# Patient Record
Sex: Female | Born: 1935 | Race: White | Hispanic: No
Health system: Southern US, Community
[De-identification: ages and names within clinical notes are randomized; demographics above are authoritative.]

---

## 2004-02-09 ENCOUNTER — Other Ambulatory Visit: Payer: Self-pay

## 2004-02-10 ENCOUNTER — Other Ambulatory Visit: Payer: Self-pay

## 2004-09-06 ENCOUNTER — Ambulatory Visit: Payer: Self-pay | Admitting: Internal Medicine

## 2004-12-01 ENCOUNTER — Ambulatory Visit: Payer: Self-pay | Admitting: Urology

## 2006-05-16 ENCOUNTER — Ambulatory Visit: Payer: Self-pay | Admitting: Specialist

## 2007-04-05 ENCOUNTER — Other Ambulatory Visit: Payer: Self-pay

## 2007-04-05 ENCOUNTER — Emergency Department: Payer: Self-pay | Admitting: Emergency Medicine

## 2007-04-18 ENCOUNTER — Ambulatory Visit: Payer: Self-pay | Admitting: Specialist

## 2007-05-04 ENCOUNTER — Inpatient Hospital Stay: Payer: Self-pay | Admitting: Specialist

## 2007-05-04 ENCOUNTER — Other Ambulatory Visit: Payer: Self-pay

## 2008-06-25 ENCOUNTER — Other Ambulatory Visit: Payer: Self-pay

## 2008-06-26 ENCOUNTER — Inpatient Hospital Stay: Payer: Self-pay | Admitting: *Deleted

## 2008-06-26 ENCOUNTER — Other Ambulatory Visit: Payer: Self-pay

## 2009-07-06 ENCOUNTER — Emergency Department: Payer: Self-pay | Admitting: Emergency Medicine

## 2009-07-06 ENCOUNTER — Inpatient Hospital Stay: Payer: Self-pay | Admitting: *Deleted

## 2009-07-10 ENCOUNTER — Emergency Department: Payer: Self-pay | Admitting: Unknown Physician Specialty

## 2009-12-06 ENCOUNTER — Ambulatory Visit: Payer: Self-pay | Admitting: Urology

## 2010-03-18 ENCOUNTER — Inpatient Hospital Stay: Payer: Self-pay | Admitting: Surgery

## 2010-06-24 ENCOUNTER — Emergency Department: Payer: Self-pay | Admitting: Emergency Medicine

## 2010-11-24 ENCOUNTER — Emergency Department: Payer: Self-pay | Admitting: Emergency Medicine

## 2011-07-04 ENCOUNTER — Inpatient Hospital Stay: Payer: Self-pay | Admitting: Internal Medicine

## 2012-03-20 ENCOUNTER — Emergency Department: Payer: Self-pay | Admitting: Internal Medicine

## 2012-05-02 LAB — URINALYSIS, COMPLETE
Bilirubin,UR: NEGATIVE
Glucose,UR: NEGATIVE mg/dL (ref 0–75)
Ph: 7 (ref 4.5–8.0)
RBC,UR: 7 /HPF (ref 0–5)
Specific Gravity: 1.012 (ref 1.003–1.030)
Squamous Epithelial: 1

## 2012-05-03 ENCOUNTER — Inpatient Hospital Stay: Payer: Self-pay | Admitting: Specialist

## 2012-05-03 LAB — TROPONIN I: Troponin-I: <0.02 ng/mL

## 2012-05-03 LAB — CBC
HCT: 38.6 % (ref 35.0–47.0)
MCH: 31.7 pg (ref 26.0–34.0)
MCHC: 34.3 g/dL (ref 32.0–36.0)
MCV: 93 fL (ref 80–100)
Platelet: 230 10*3/uL (ref 150–440)
RDW: 15.3 % — ABNORMAL HIGH (ref 11.5–14.5)
WBC: 8.9 10*3/uL (ref 3.6–11.0)

## 2012-05-03 LAB — COMPREHENSIVE METABOLIC PANEL
Alkaline Phosphatase: 61 U/L (ref 50–136)
BUN: 16 mg/dL (ref 7–18)
Bilirubin,Total: 0.2 mg/dL (ref 0.2–1.0)
Chloride: 102 mmol/L (ref 98–107)
Co2: 28 mmol/L (ref 21–32)
Creatinine: 0.63 mg/dL (ref 0.60–1.30)
EGFR (African American): >60
EGFR (Non-African Amer.): >60
SGOT(AST): 19 U/L (ref 15–37)
SGPT (ALT): 16 U/L
Sodium: 140 mmol/L (ref 136–145)
Total Protein: 7.6 g/dL (ref 6.4–8.2)

## 2012-05-03 LAB — CK TOTAL AND CKMB (NOT AT ARMC)
CK, Total: 38 U/L (ref 21–215)
CK-MB: <0.5 ng/mL — ABNORMAL LOW (ref 0.5–3.6)

## 2012-05-04 LAB — HEMOGLOBIN A1C: Hemoglobin A1C: 6 % (ref 4.2–6.3)

## 2012-05-05 LAB — BASIC METABOLIC PANEL
BUN: 9 mg/dL (ref 7–18)
Calcium, Total: 8.7 mg/dL (ref 8.5–10.1)
Chloride: 103 mmol/L (ref 98–107)
Creatinine: 0.39 mg/dL — ABNORMAL LOW (ref 0.60–1.30)
Osmolality: 280 (ref 275–301)
Potassium: 3.4 mmol/L — ABNORMAL LOW (ref 3.5–5.1)

## 2012-10-02 ENCOUNTER — Emergency Department: Payer: Self-pay | Admitting: Emergency Medicine

## 2012-11-07 ENCOUNTER — Emergency Department: Payer: Self-pay | Admitting: Internal Medicine

## 2012-11-07 LAB — COMPREHENSIVE METABOLIC PANEL
Albumin: 3.1 g/dL — ABNORMAL LOW (ref 3.4–5.0)
Anion Gap: 5 — ABNORMAL LOW (ref 7–16)
Bilirubin,Total: 0.3 mg/dL (ref 0.2–1.0)
Calcium, Total: 8.8 mg/dL (ref 8.5–10.1)
Co2: 28 mmol/L (ref 21–32)
EGFR (African American): >60
Osmolality: 279 (ref 275–301)
SGOT(AST): 15 U/L (ref 15–37)
SGPT (ALT): 15 U/L (ref 12–78)
Sodium: 140 mmol/L (ref 136–145)
Total Protein: 7 g/dL (ref 6.4–8.2)

## 2012-11-07 LAB — URINALYSIS, COMPLETE
Bilirubin,UR: NEGATIVE
Glucose,UR: NEGATIVE mg/dL (ref 0–75)
Nitrite: POSITIVE
Ph: 7 (ref 4.5–8.0)
RBC,UR: 6 /HPF (ref 0–5)
Specific Gravity: 1.011 (ref 1.003–1.030)

## 2012-11-07 LAB — TROPONIN I: Troponin-I: <0.02 ng/mL

## 2012-11-07 LAB — CBC
HCT: 39.2 % (ref 35.0–47.0)
HGB: 12.6 g/dL (ref 12.0–16.0)
MCH: 29.7 pg (ref 26.0–34.0)
MCV: 92 fL (ref 80–100)
Platelet: 210 10*3/uL (ref 150–440)

## 2012-11-07 LAB — PRO B NATRIURETIC PEPTIDE: B-Type Natriuretic Peptide: 1086 pg/mL — ABNORMAL HIGH (ref 0–450)

## 2012-11-09 LAB — URINE CULTURE

## 2012-11-13 LAB — CULTURE, BLOOD (SINGLE)

## 2012-12-24 ENCOUNTER — Emergency Department: Payer: Self-pay | Admitting: Emergency Medicine

## 2013-03-02 DEATH — deceased

## 2013-09-27 IMAGING — CR DG LUMBAR SPINE 2-3V
1 series · 3 of 3 positions shown · non-contrast
Comparison: none

REASON FOR EXAM: s/p fall
COMMENTS:

[Series 1: t lumbar spine ap · 0.14mm/px · 3 of 3 slices shown]
[im 1/3]
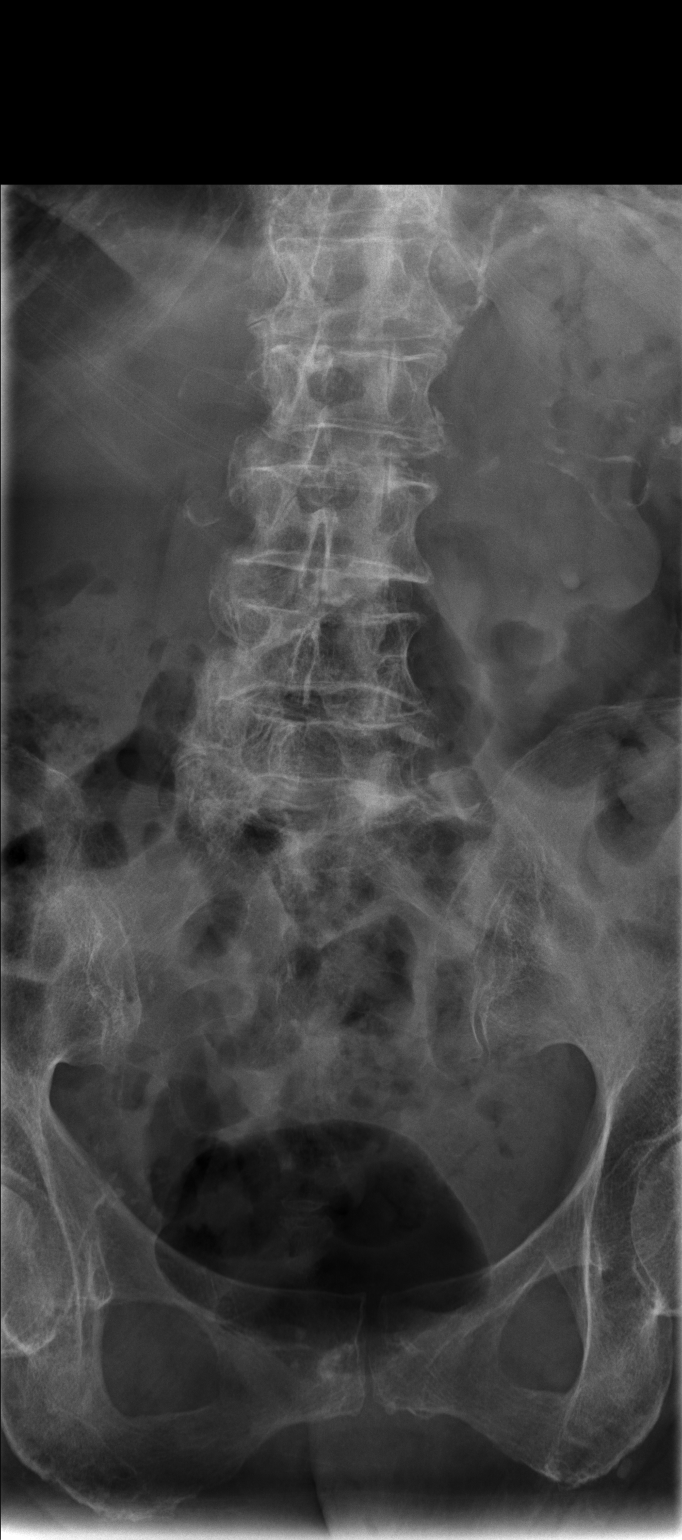
[im 2/3]
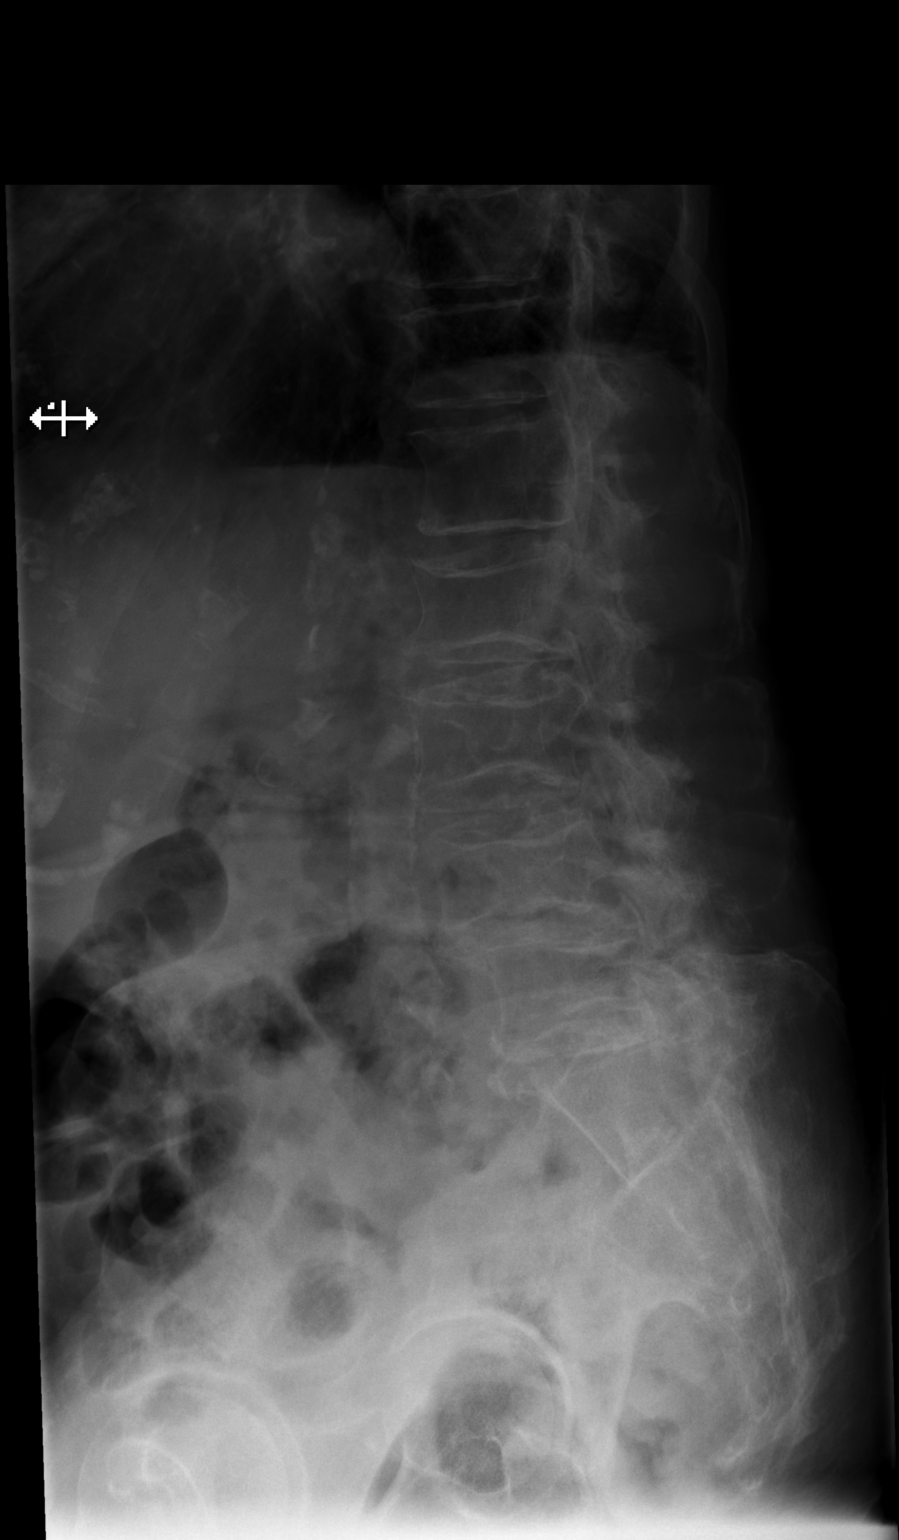
[im 3/3]
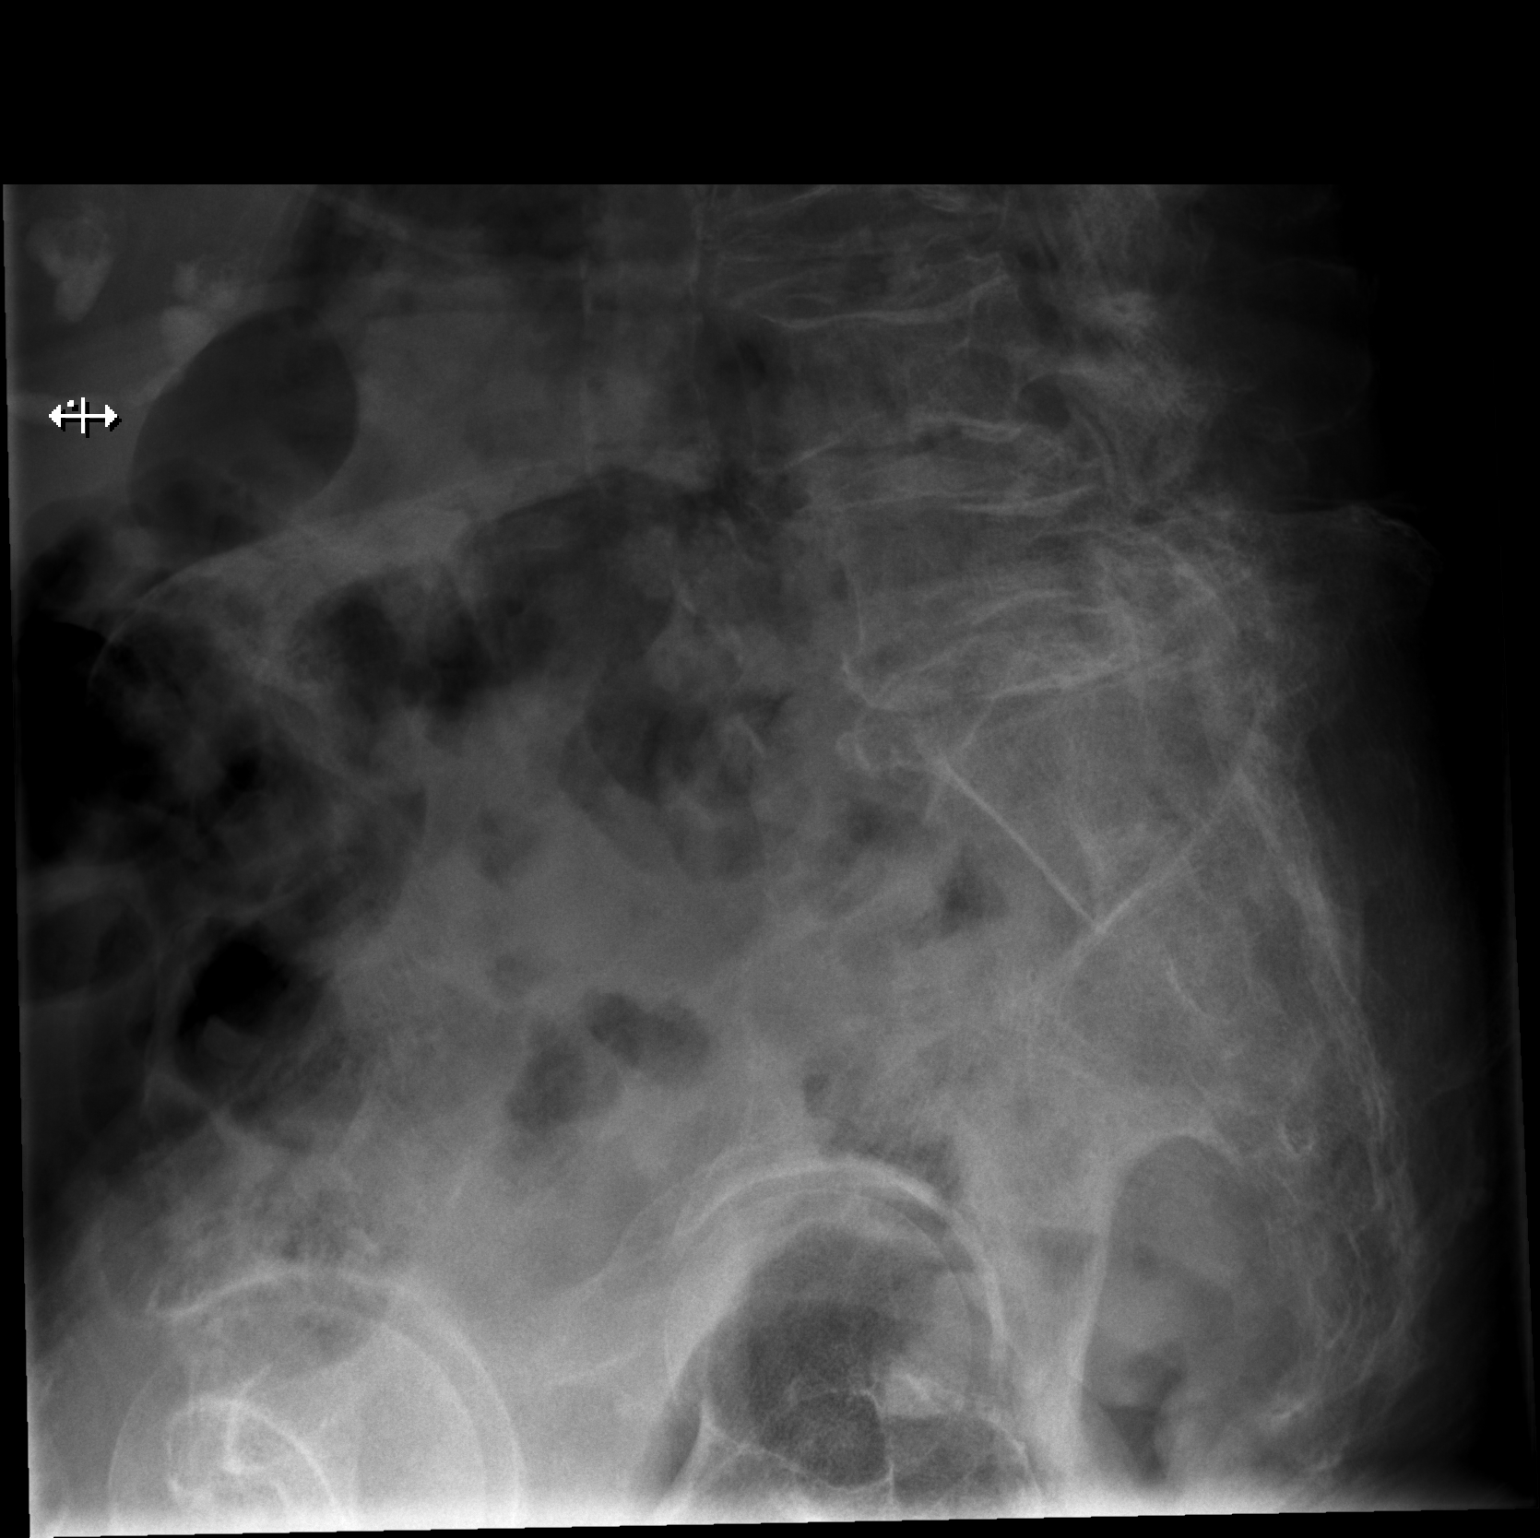

[3 of 3 positions shown; findings below may reference images not displayed]

PROCEDURE:     DXR - DXR LUMBAR SPINE AP AND LATERAL  - December 24, 2012  [DATE]

RESULT:     Comparison is made to study 02 May, 2012. There is diffuse
osteopenia. A there is slight loss of height in the L5 vertebral body with
anterolisthesis of L5 on S1 and L4 on L5 which appear to be unchanged. There
is some loss of height in the L4 vertebral body. There is slight loss of
height in the L3 vertebral body. Alignment is otherwise maintained.
Atherosclerotic disease is present.
IMPRESSION: Severe osteopenia with multilevel compression deformity of
L3-L5 which is of uncertain chronicity. MRI followup may be beneficial.
Anterolisthesis of L5 on S1 which is minimal and of L4 on L5 which is
unchanged.

[REDACTED]

## 2013-09-27 IMAGING — CT CT HEAD WITHOUT CONTRAST
2 series · 15 of 30 positions shown, 19 images · non-contrast
Comparison: none

REASON FOR EXAM: s/p fall
COMMENTS:

[Series 2: without · axial · non-contrast · 0.40mm/px · z∈[+466,+586]mm · 13 of 29 slices shown, 17 images]
[im 3/29  brain]
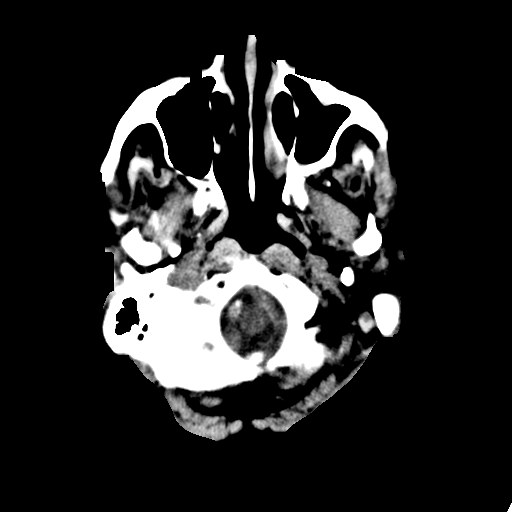
[im 3/29  bone]
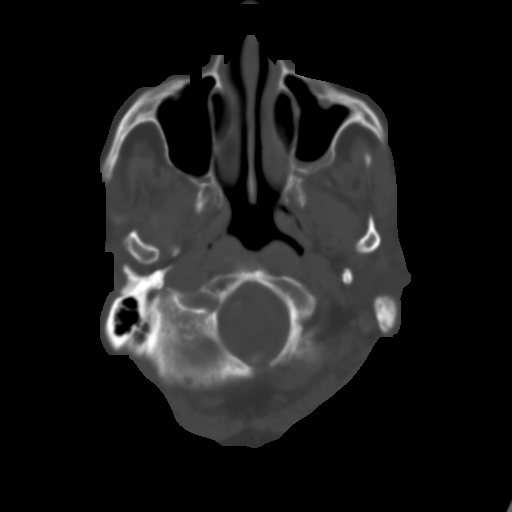
[im 5/29  brain]
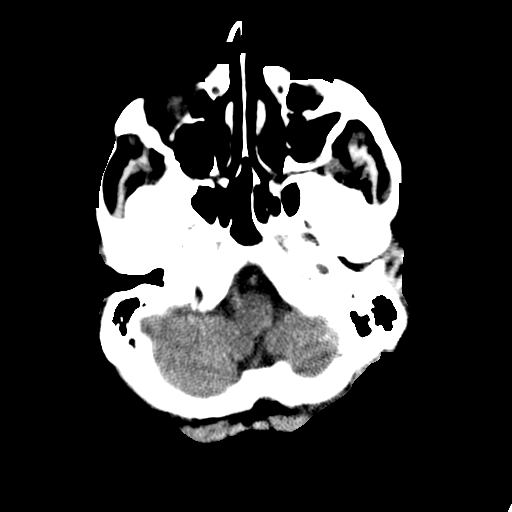
[im 7/29  brain]
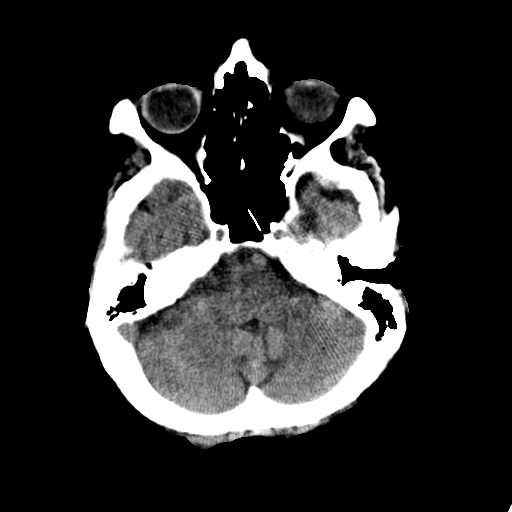
[im 9/29  brain]
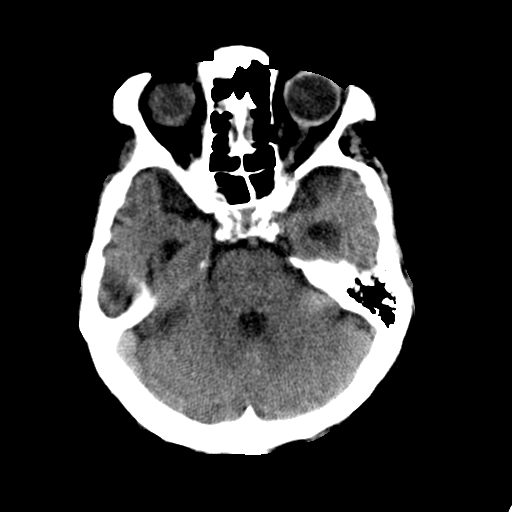
[im 11/29  brain]
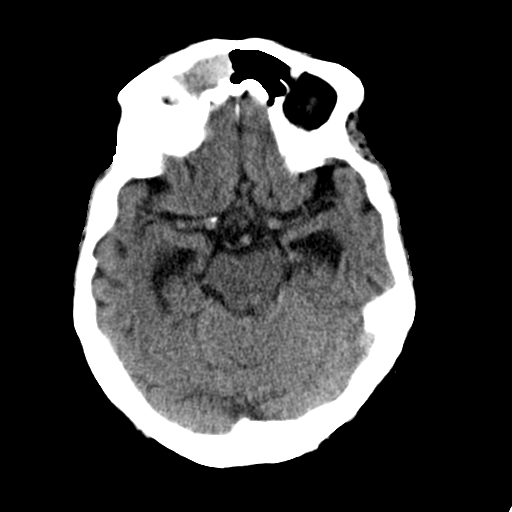
[im 11/29  bone]
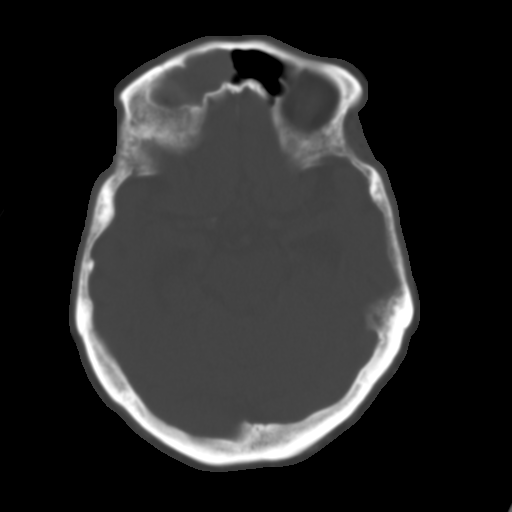
[im 13/29  brain]
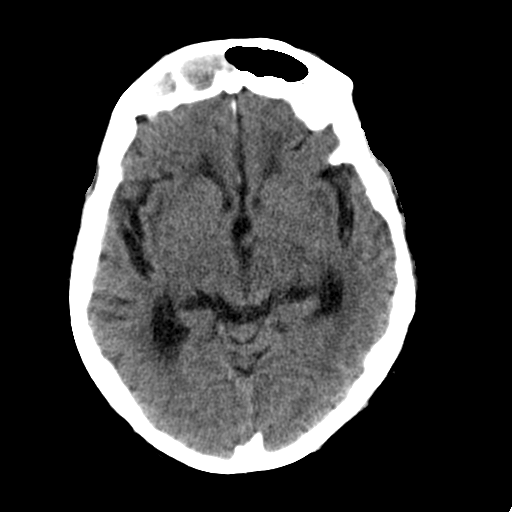
[im 15/29  brain]
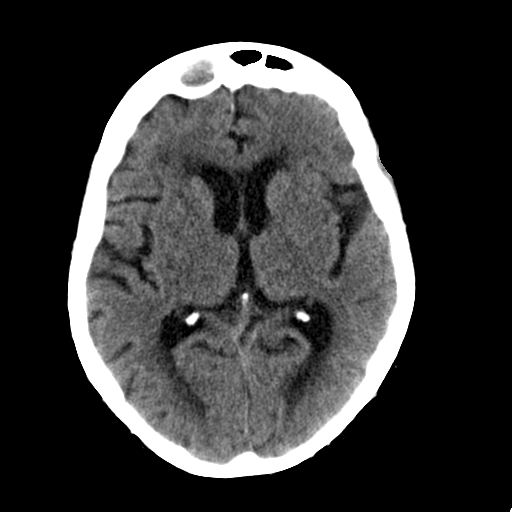
[im 17/29  brain]
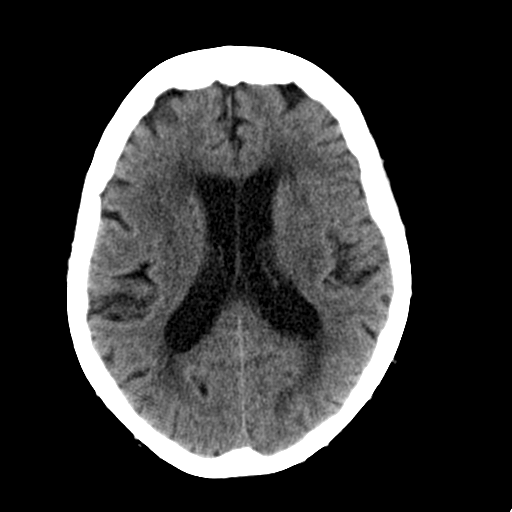
[im 19/29  brain]
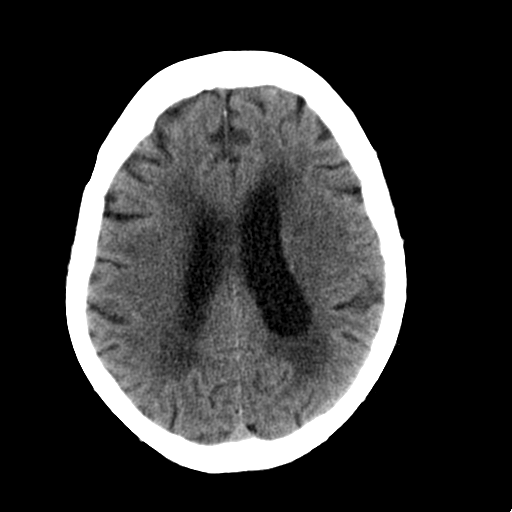
[im 19/29  bone]
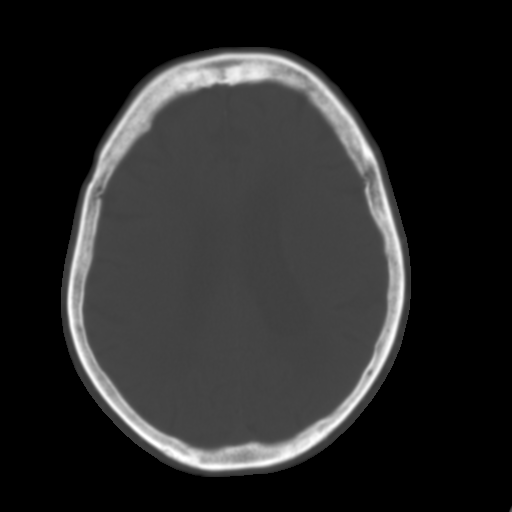
[im 21/29  brain]
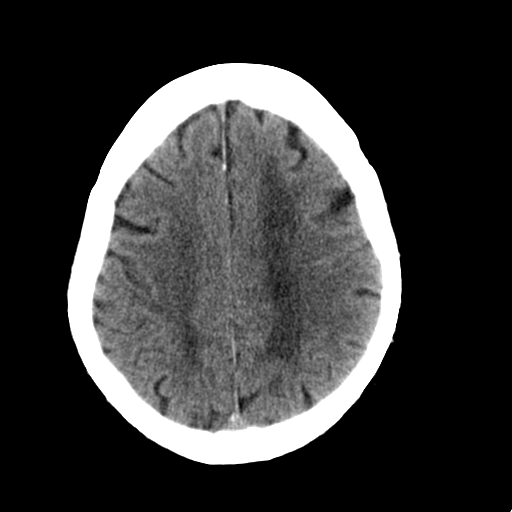
[im 23/29  brain]
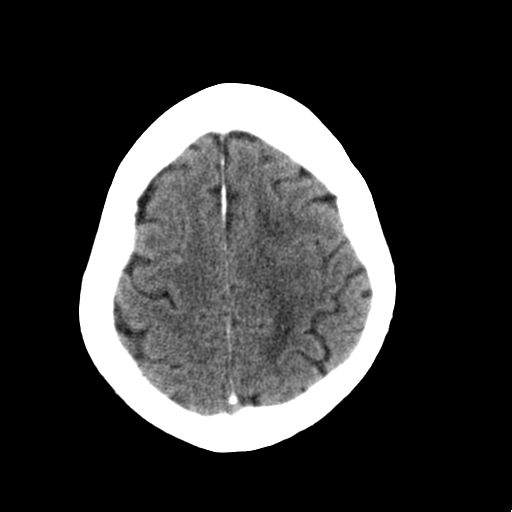
[im 25/29  brain]
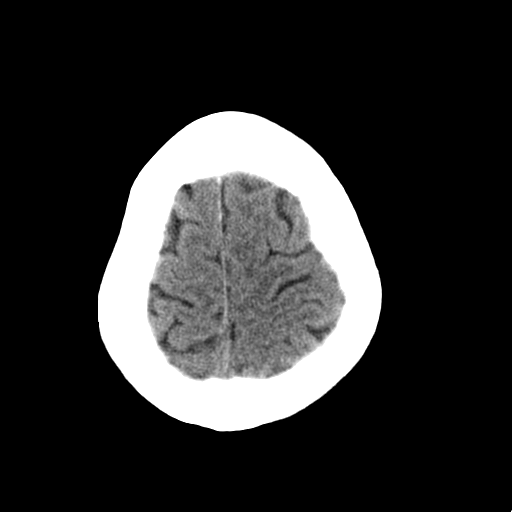
[im 27/29  brain]
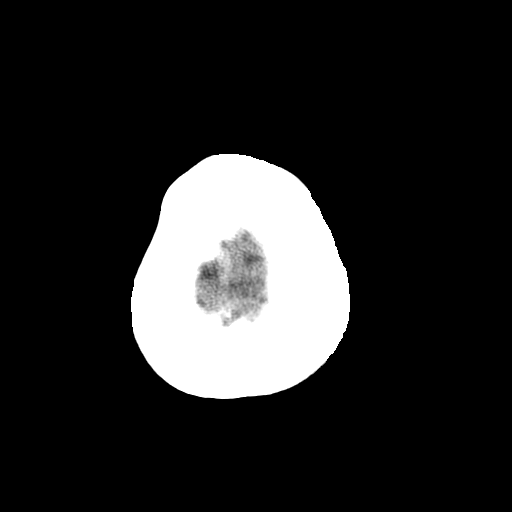
[im 27/29  bone]
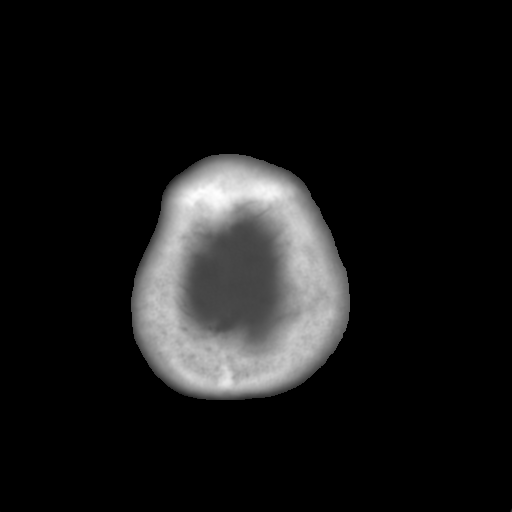

[Series 3: bone · axial · 0.40mm/px · z∈[+466,+486]mm · 2 of 29 slices shown]
[im 3/29  bone]
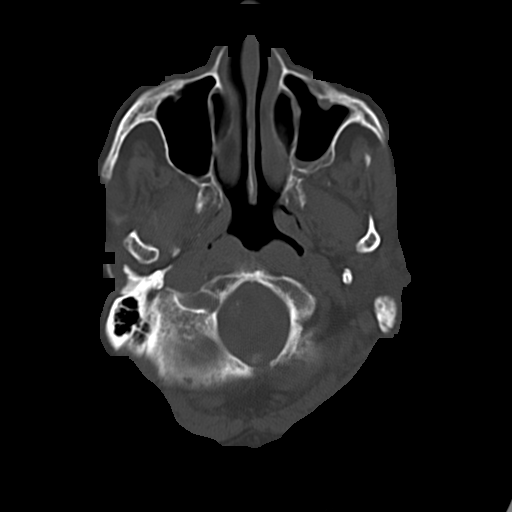
[im 7/29  bone]
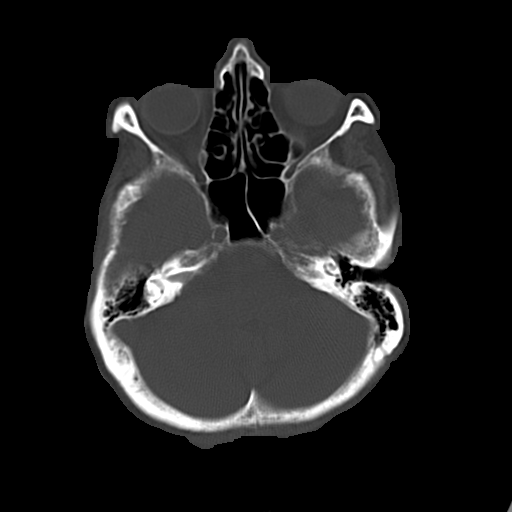

[15 of 30 positions shown; findings below may reference images not displayed]

PROCEDURE:     CT  - CT HEAD WITHOUT CONTRAST  - December 24, 2012  [DATE]

RESULT:     Noncontrast emergent CT of the brain is compared to the previous
study 02 October, 2012. There is prominence of the ventricles and sulci
consistent with diffuse atrophy. Low-attenuation is seen within the
periventricular and subcortical white matter diffusely with right basal
ganglia lacunar infarct present. This is unchanged. There is no intracranial
hemorrhage, mass or mass effect. There is no midline shift or evolving
infarct evident. Prominent atherosclerotic calcification is noted in the
posterior circulation. There is minimal fluid in the posterior left
maxillary sinus. There is complete opacification of the right frontal sinus
and a portion of the superior anterior right ethmoid sinus. There is normal
aeration of the mastoids and remaining sinuses. The calvarium shows no
depressed skull fracture.
IMPRESSION: Complete opacification of the right frontal sinus as
previously. Small amount of fluid in the left maxillary sinus. Changes of
diffuse atrophy with chronic microvascular ischemic disease. No acute
intracranial abnormality evident.

[REDACTED]
IMPRESSION:

## 2015-01-19 NOTE — Discharge Summary (Signed)
PATIENT NAME:  Sara Ramsey, Sara Ramsey MR#:  [id number] DATE OF BIRTH:  1920/04/20  DATE OF ADMISSION:  05/03/2012 DATE OF DISCHARGE:  05/05/2012  For a detailed note, please take a look at the history and physical done on admission by Dr. Imogene Burnhen.   DIAGNOSES AT DISCHARGE:  1. Encephalopathy likely secondary to urinary tract infection. 2. Urinary tract infection.  3. Underlying dementia.  4. Hypertension.  5. Gastroesophageal reflux disease.  6. Chronic pain secondary to falls.   DIET: Patient is being discharged on a low-sodium, low-fat, American [employer] diet.   ACTIVITY: As tolerated.   FOLLOW UP: Follow up with Dr. Meredeth IdeFleming in the next 1 to 2 weeks.   DISCHARGE MEDICATIONS: 1. Tylenol 650 q.6 hours as needed.  2. Digoxin 0.125 mcg daily.  3. Nexium 40 mg daily.  4. Actos 30 mg daily.  5. Amlodipine 5 mg daily.  6. Aspirin 81 mg daily.  7. Iron sulfate 1 tab b.i.d.  8. Lasix 20 mg daily.  9. Potassium 10 mEq daily. 10. Loratadine 10 mg daily.  11. Losartan 100 mg daily.  12. Paxil 10 mg daily.  13. MiraLax daily as needed.  14. Spiriva 1 puff daily.  15. Calcium carbonate 1 tab b.i.d.  16. Colace 100 mg b.i.d.  17. Coreg 12.5 mg b.i.d.  18. Namenda 10 mg b.i.d.  19. Albuterol inhaler q.6 hours as needed. 20. Tylenol with hydrocodone 10/325, 1 tab b.i.d. as needed.  21. Sublingual nitroglycerin as needed.  22. Ceftin 500 mg b.i.d. x5 days.   LABORATORY, DIAGNOSTIC AND RADIOLOGICAL DATA: CT scan of the head done without contrast on admission showing no acute intracranial abnormality. CT of the cervical spine showing chronic degenerative changes but no fracture or dislocation. A chest x-ray done on admission showing a small left pleural effusion. No evidence of any other acute cardiopulmonary disease. X-ray of the pelvis showing no bony abnormality. X-ray of the lumbar spine showing osteopenia but no acute compression fracture. X-ray of the thoracic spine showing also  osteopenia and scoliosis but no acute fracture.   HOSPITAL COURSE: This is a 81 year old female with medical problems as mentioned above presented to the hospital on 08/02 secondary to a fall and worsening mental status.  1. Encephalopathy. This was likely metabolic encephalopathy associated with a urinary tract infection. Patient underwent a CT scan of her head and cervical spine which was negative. She was empirically started on IV ceftriaxone. Patient's mental status has significantly improved since admission and is now back down to baseline. Her urine culture grew out 100,000 colonies of Escherichia coli which is sensitive to ceftriaxone therefore she is being discharged on p.o. Ceftin for next few days.  2. Urinary tract infection. As mentioned, this was secondary to Escherichia coli. She was treated with IV ceftriaxone, is currently being discharged on p.o. Ceftin.  3. Hypertension. Patient remained hemodynamically stable on her amlodipine and her Coreg. She will resume that upon discharge.  4. Anxiety. Patient was maintained on her Paxil. She will resume that upon discharge.  5. Dementia. Patient was maintained on Namenda and she will continue that.  6. Falls. Patient had recurrent falls prior to coming in. She was evaluated by physical therapy who recommended she return back to her assisted living facility with ongoing physical therapy.  7. CODE STATUS: Patient is a DO NOT RESUSCITATE, DO NOT RESUSCITATE.      TIME SPENT: 40 minutes.  ____________________________ Rolly PancakeVivek J. Cherlynn KaiserSainani, MD vjs:cms D: 05/05/2012 15:14:08 ET T: 05/06/2012  10:55:56 ET JOB#: [id number]  cc: Rolly Pancake. Cherlynn Kaiser, MD, <Dictator> Sara Ramsey. Meredeth Ide, MD  Houston Siren MD ELECTRONICALLY SIGNED 05/07/2012 8:01

## 2015-01-19 NOTE — H&P (Signed)
PATIENT NAME:  Sara Ramsey, Sara Ramsey MR#:  [id number] DATE OF BIRTH:  09-Apr-1920  DATE OF ADMISSION:  05/03/2012  PRIMARY CARE PHYSICIAN: Sara ClinesHerbon Fleming, MD  REFERRING PHYSICIAN: Dr. Ladona Ramsey  CHIEF COMPLAINT: Fall and altered mental status.   HISTORY OF PRESENT ILLNESS: The patient is a 81 year old Caucasian female with a history of hypertension, diabetes, hyperlipidemia, chronic obstructive pulmonary disease, congestive heart failure with an ejection fraction of 35%, and dementia who presented to the ED with above chief complaint. The patient is alert, awake, and mildly demented, but the patient could not provide detailed information. According to her daughter, the patient fell in her assisted living last night and was noted to have worsening mental status. In addition, the patient has back pain, fever or chills, polyuria, and weakness. The patient also complains of lower abdominal pain, but denies any other symptoms. The patient was noted to have a urinary tract infection and spine x-ray showed a T4-T5 compression fracture.   PAST MEDICAL HISTORY:  1. Hypertension.  2. Hyperlipidemia.  3. Diabetes.  4. Dementia.  5. Congestive heart failure with ejection fraction 35%. 6. Arthritis. 7. Sinusitis. 8. Chronic obstructive pulmonary disease. 9. Depression and anxiety. 10. Complete bowel obstruction with exploratory laparotomy.   PAST SURGICAL HISTORY:  1. Tonsillectomy.  2. Hysterectomy.  3. Benign ovarian tumor resection. 4. Exploratory laparotomy.  5. Lysis repairs. 6. Cirrhosis involving small bowel and colon.  SOCIAL HISTORY: The patient quit smoking several years ago. Denies any alcohol drinking or illicit drugs. She lives in an assisted living facility.   ALLERGIES: Achromycin, BuSpar, Centrex, codeine, Inderal, Macrobid, Naprosyn, Septra, spironolactone, sulfa, and tetracycline.   HOME MEDICATIONS: Please refer to the medication list.  REVIEW OF SYSTEMS: CONSTITUTIONAL: Positive for  fever, chills, and weakness. No weight loss. EYES: No double vision or blurred vision. ENT: No epistaxis. Has hearing loss. No dysphagia, slurred speech, or post nasal drip. RESPIRATORY: No cough, sputum, or hemoptysis, but sometimes has shortness of breath. CARDIOVASCULAR: No chest pain, palpitation, orthopnea, or nocturnal dyspnea. The patient had complained of leg edema for a long time. GI: Lower abdominal pain, but no nausea, vomiting, or diarrhea. No melena or bloody stool. GENITOURINARY: No dysuria, hematuria, or incontinence. ENDOCRINE: Has polyuria but no polydipsia, heat or cold intolerance. HEMATOLOGY: No easy bruising or bleeding. NEUROLOGY: No syncope, loss of consciousness, or seizure, but has dementia.   PHYSICAL EXAMINATION:   VITAL SIGNS: Temperature 97.4, blood pressure 158/82, pulse 72, respirations 18, and oxygen saturation 94% on room air.   GENERAL: The patient is alert, awake, and oriented, in no acute distress.   HEENT: Pupils are round, equal, and reactive to light and accommodation. Moist oral mucosa. Clear oropharynx.   NECK: Supple. No JVD or carotid bruit. No lymphadenopathy. No thyromegaly.   CARDIOVASCULAR: S1 and S2 regular rate and rhythm. No murmurs, rubs or gallops.   PULMONARY: Bilateral air entry. No wheezing or rales. No use of accessory muscles to breathe.   ABDOMEN: Soft. No distention. Has mild tenderness in  the lower part. No rigidity. No rebound.   EXTREMITIES: No edema, clubbing, or cyanosis. No calf tenderness. Strong bilateral pedal pulses. Has mild ankle deformity.   MUSCULOSKELETAL: The patient also has tenderness in the thoracic spine.   NEUROLOGIC: The patient is weak and alert but demented. No focal deficit. Sensation intact.   SKIN: No rash or jaundice.   RESULTS: ABG showed pH of 7.42, pCO2 46, and pO2 63.  Glucose 143, BUN 16, creatinine 0.63,  potassium 3.4, sodium 140, and chloride 102. CBC normal.   Urinalysis showed nitrite  positive, WBC 19, and RBC 7.  CT of cervical spine: No acute abnormality.  CT of head: No acute intracranial abnormality.   Thoracic spine showed T4-T5 compression fracture, which is new according to the patient's daughter   IMPRESSION:  1. Altered mental status.  2. Fall.  3. Urinary tract infection.  4. Hypoxia.  5. Hypokalemia. 6. T4-T5 fracture.  7. Uncontrolled hypertension.  8. Diabetes.  9. Chronic obstructive pulmonary disease.  10. History of congestive heart failure with ejection fraction 35%.  11. Dementia.   PLAN OF TREATMENT:  1. The patient will be admitted to medical floor and start fall precautions and Rocephin IVPB and follow-up urine culture.  2. For hypokalemia, we will give potassium and follow-up potassium and magnesium level. 3. For hypoxia and chronic obstructive pulmonary disease, we will continue home medications and nebulizer.  4. For diabetes, we will start sliding scale.  5. For hypertension, we will continue the patient's home hypertension medication, but increase Norvasc to 10 mg p.o. daily and continue Coreg and Losartan.  6. GI and deep vein thrombosis prophylaxis.   I discussed the patient's situation and plan of treatment with the patient's daughter and grandson.   TIME SPENT: About 55 minutes. ____________________________ Sara Pollack, MD qc:slb D: 05/03/2012 03:42:27 ET T: 05/03/2012 08:38:25 ET JOB#: [id number]  cc: Sara Pollack, MD, <Dictator> Sara Ramsey. Meredeth Ide, MD Sara Pollack MD ELECTRONICALLY SIGNED 05/04/2012 16:48
